# Patient Record
Sex: Male | Born: 1985 | Race: Black or African American | Hispanic: No | Marital: Single | Smoking: Current some day smoker
Health system: Southern US, Community
[De-identification: ages and names within clinical notes are randomized; demographics above are authoritative.]

---
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
## 2003-10-16 ENCOUNTER — Emergency Department (HOSPITAL_COMMUNITY): Admission: EM | Admit: 2003-10-16 | Discharge: 2003-10-16 | Payer: Self-pay | Admitting: Emergency Medicine

## 2013-11-12 ENCOUNTER — Ambulatory Visit: Payer: Self-pay | Admitting: Physician Assistant

## 2013-11-12 VITALS — BP 116/68 | HR 70 | Temp 98.2°F | Resp 16 | Ht 69.0 in | Wt 194.4 lb

## 2013-11-12 DIAGNOSIS — M62838 Other muscle spasm: Secondary | ICD-10-CM

## 2013-11-12 DIAGNOSIS — M542 Cervicalgia: Secondary | ICD-10-CM

## 2013-11-12 MED ORDER — DICLOFENAC SODIUM 75 MG PO TBEC: 75.0000 mg | DELAYED_RELEASE_TABLET | Freq: Two times a day (BID) | ORAL | Status: AC | PRN

## 2013-11-12 MED ORDER — CYCLOBENZAPRINE HCL 5 MG PO TABS: 5.0000 mg | ORAL_TABLET | Freq: Three times a day (TID) | ORAL | Status: AC | PRN

## 2013-11-12 NOTE — Progress Notes (Signed)
   Subjective:    Patient ID: Marcus Goodman, male    DOB: 1986-01-19, 28 y.o.   MRN: [id number]  HPI Pt presents to clinic for evaluation of his neck pain that started about 2 weeks ago when he was playing basketball and he was hit on the left side of his face pushing his head to the right side of his body.  He has had mid-neck pain since then.  He has been using heat and advil pm at night but he has not improved like he expected and he has started to get concerned.  He is not having any weakness, paresthesias or numbness in his arms/fingers. He has never had an injury to his neck in the past.  He currently does not have pain in face or teeth.  He also has an area on his left flank that does not bother hum but it has been there for several months and he just wants to make sure it is ok.  Home treatment - Heat and advil pm (2 pills at night)  Review of Systems  Neurological: Negative for weakness and numbness.       Objective:   Physical Exam  Vitals reviewed. Constitutional: He is oriented to person, place, and time. He appears well-developed and well-nourished.  HENT:  Head: Normocephalic and atraumatic.  Right Ear: External ear normal.  Left Ear: External ear normal.  Pulmonary/Chest: Effort normal.  Musculoskeletal:       Cervical back: He exhibits decreased range of motion. He exhibits no tenderness.  Holding his head to the right.  Neurological: He is alert and oriented to person, place, and time.  Reflex Scores:      Tricep reflexes are 2+ on the right side and 2+ on the left side.      Bicep reflexes are 2+ on the right side and 2+ on the left side.      Brachioradialis reflexes are 2+ on the right side and 2+ on the left side. Skin: Skin is warm and dry. Rash noted. Rash is nodular.     Psychiatric: He has a normal mood and affect. His behavior is normal. Judgment and thought content normal.       Assessment & Plan:  Neck pain - Plan: diclofenac (VOLTAREN) 75 MG EC  tablet  Muscle spasms of head or neck - Plan: cyclobenzaprine (FLEXERIL) 5 MG tablet  Fibroma - continue to monitor but nothing concerning at this time.  Due to the length of time but a normal exam we will start with NSIADs and muscle relaxers and continued heat.  If he has no improvement in his symptoms he will f/u for an xray at that time.  Benny LennertSarah Weber PA-C  [facility] 11/12/2013 12:09 PM

## 2013-11-12 NOTE — Patient Instructions (Signed)
Heat to the area of your neck that is bothering you.

## 2015-10-11 ENCOUNTER — Emergency Department (INDEPENDENT_AMBULATORY_CARE_PROVIDER_SITE_OTHER)
Admission: EM | Admit: 2015-10-11 | Discharge: 2015-10-11 | Disposition: A | Discharge status: Home / Self Care | Payer: Self-pay | Source: Home / Self Care | Attending: Family Medicine | Admitting: Family Medicine

## 2015-10-11 ENCOUNTER — Encounter (HOSPITAL_COMMUNITY): Payer: Self-pay | Admitting: Emergency Medicine

## 2015-10-11 DIAGNOSIS — K2970 Gastritis, unspecified, without bleeding: Secondary | ICD-10-CM

## 2015-10-11 NOTE — ED Notes (Signed)
[facility] a [facility] medial and upper abdominal pain and diarrhea that started today.

## 2015-10-11 NOTE — Discharge Instructions (Signed)

## 2015-10-13 NOTE — ED Provider Notes (Signed)
CSN: [id number]     Arrival date & time 10/11/15  2002 History   First MD Initiated Contact with Patient 10/11/15 2117     Chief Complaint  Patient presents with  . Abdominal Pain  . Diarrhea   (Consider location/radiation/quality/duration/timing/severity/associated sxs/prior Treatment) HPI Ate ribs yesterday and 30 minutes later having diarrhea.  No pain or blood in stools. Taking fluids, not eating. 5 episodes of diarrhea in the last 24 hours.  History reviewed. No pertinent past medical history. History reviewed. No pertinent past surgical history. Family History  Problem Relation Age of Onset  . Cancer Father    Social History  Substance Use Topics  . Smoking status: Current Some Day Smoker  . Smokeless tobacco: None  . Alcohol Use: Yes     Comment: socially    Review of Systems diarrhea Allergies  Review of patient's allergies indicates no known allergies.  Home Medications   Prior to Admission medications   Medication Sig Start Date End Date Taking? Authorizing Provider  cyclobenzaprine (FLEXERIL) 5 MG tablet Take 1 tablet (5 mg total) by mouth 3 (three) times daily as needed for muscle spasms. 11/12/13   Morrell RiddleSarah L Weber, PA-C  diclofenac (VOLTAREN) 75 MG EC tablet Take 1 tablet (75 mg total) by mouth 2 (two) times daily as needed. 11/12/13   Morrell RiddleSarah L Weber, PA-C   Meds Ordered and Administered this Visit  Medications - No data to display  BP 109/64 mmHg  Pulse 64  Temp(Src) 98 F (36.7 C) (Oral)  Resp 18  SpO2 99% No data found.   Physical Exam NURSES NOTES AND VITAL SIGNS REVIEWED. CONSTITUTIONAL: Well developed, well nourished, no acute distress HEENT: normocephalic, atraumatic, right and left TM's are normal EYES: Conjunctiva normal NECK:normal ROM, supple, no adenopathy PULMONARY:No respiratory distress, normal effort, Lungs: CTAb/l, no wheezes, or increased work of breathing CARDIOVASCULAR: RRR, no murmur ABDOMEN: soft, ND, NT, +'ve  BS MUSCULOSKELETAL: Normal ROM of all extremities,  SKIN: warm and dry without rash PSYCHIATRIC: Mood and affect, behavior are normal  ED Course  Procedures (including critical care time)  Labs Review Labs Reviewed - No data to display  Imaging Review No results found.   Visual Acuity Review  Right Eye Distance:   Left Eye Distance:   Bilateral Distance:    Right Eye Near:   Left Eye Near:    Bilateral Near:      No meds suggested as symptoms are getting better.  Continue symptomatic treatment.  MDM   1. Gastritis     Patient is reassured that there are no issues that require transfer to higher level of care at this time or additional tests. Patient is advised to continue home symptomatic treatment. Patient is advised that if there are new or worsening symptoms to attend the emergency department, contact primary care provider, or return to UC. Instructions of care provided discharged home in stable condition. Return to work/school note provided.   THIS NOTE WAS GENERATED USING A VOICE RECOGNITION SOFTWARE PROGRAM. ALL REASONABLE EFFORTS  WERE MADE TO PROOFREAD THIS DOCUMENT FOR ACCURACY.  I have verbally reviewed the discharge instructions with the patient. A printed AVS was given to the patient.  All questions were answered prior to discharge.      Tharon AquasFrank C Laraya Pestka, PA 10/13/15 1002

## 2015-10-18 ENCOUNTER — Encounter (HOSPITAL_COMMUNITY): Payer: Self-pay | Admitting: Emergency Medicine

## 2015-10-18 ENCOUNTER — Emergency Department (INDEPENDENT_AMBULATORY_CARE_PROVIDER_SITE_OTHER)
Admission: EM | Admit: 2015-10-18 | Discharge: 2015-10-18 | Disposition: A | Discharge status: Home / Self Care | Payer: Self-pay | Source: Home / Self Care | Attending: Emergency Medicine | Admitting: Emergency Medicine

## 2015-10-18 DIAGNOSIS — K2970 Gastritis, unspecified, without bleeding: Secondary | ICD-10-CM

## 2015-10-18 MED ORDER — OMEPRAZOLE 40 MG PO CPDR: 40.0000 mg | DELAYED_RELEASE_CAPSULE | Freq: Every day | ORAL | Status: AC

## 2015-10-18 NOTE — ED Notes (Signed)
C/o persistent LUQ pain onset x1 week... Seen here on 3/25 for similar sx A&O x4... No acute distress.

## 2015-10-18 NOTE — Discharge Instructions (Signed)
I think your pain is coming from gastritis. This is irritation of the stomach lining. Take omeprazole daily for 2 weeks. After that you can use as needed. If this does not resolve the pain, the next step is likely seeing either a GI specialist or going to the emergency room for additional imaging and blood work.

## 2015-10-18 NOTE — ED Provider Notes (Signed)
CSN: [id number]     Arrival date & time 10/18/15  1859 History   First MD Initiated Contact with Patient 10/18/15 2056     Chief Complaint  Patient presents with  . Abdominal Pain   (Consider location/radiation/quality/duration/timing/severity/associated sxs/prior Treatment) HPI He is a 30 year old man here for evaluation of abdominal pain. He states this started about a week ago after eating some ribs. Initially, it was associated with some diarrhea, diarrhea has basically resolved at this point. He continues to have intermittent left-sided abdominal pain. He states it occurs a couple times a day. He is unable to identify any specific triggers. It lasts for about 30 minutes and then resolves. He states it will sometimes radiate up into his chest. He reports some nausea associated with it once, but none since. No fevers or chills. He has not seen any blood in stool.  History reviewed. No pertinent past medical history. History reviewed. No pertinent past surgical history. Family History  Problem Relation Age of Onset  . Cancer Father    Social History  Substance Use Topics  . Smoking status: Current Some Day Smoker  . Smokeless tobacco: None  . Alcohol Use: Yes     Comment: socially    Review of Systems History of present illness Allergies  Review of patient's allergies indicates no known allergies.  Home Medications   Prior to Admission medications   Medication Sig Start Date End Date Taking? Authorizing Provider  cyclobenzaprine (FLEXERIL) 5 MG tablet Take 1 tablet (5 mg total) by mouth 3 (three) times daily as needed for muscle spasms. 11/12/13   Morrell RiddleSarah L Weber, PA-C  diclofenac (VOLTAREN) 75 MG EC tablet Take 1 tablet (75 mg total) by mouth 2 (two) times daily as needed. 11/12/13   Morrell RiddleSarah L Weber, PA-C  omeprazole (PRILOSEC) 40 MG capsule Take 1 capsule (40 mg total) by mouth daily. For 2 weeks, then as needed. 10/18/15   Charm RingsErin J Peregrine Nolt, MD   Meds Ordered and Administered this Visit   Medications - No data to display  BP 119/82 mmHg  Pulse 51  Temp(Src) 98 F (36.7 C) (Oral)  Resp 16  SpO2 100% No data found.   Physical Exam  Constitutional: He is oriented to person, place, and time. He appears well-developed and well-nourished. No distress.  Neck: Neck supple.  Cardiovascular: Normal rate.   Pulmonary/Chest: Effort normal.  Abdominal: Soft. Bowel sounds are normal. He exhibits no distension. There is no tenderness. There is no rebound and no guarding.  Neurological: He is alert and oriented to person, place, and time.    ED Course  Procedures (including critical care time)  Labs Review Labs Reviewed - No data to display  Imaging Review No results found.   MDM   1. Gastritis    I suspect he has some mild gastritis. Will treat with a trial of omeprazole for 2 weeks. Follow-up as needed.    Charm RingsErin J Juanpablo Ciresi, MD 10/18/15 2133

## 2017-06-28 ENCOUNTER — Encounter (HOSPITAL_COMMUNITY): Payer: Self-pay | Admitting: Emergency Medicine

## 2017-06-28 ENCOUNTER — Ambulatory Visit (HOSPITAL_COMMUNITY)
Admission: EM | Admit: 2017-06-28 | Discharge: 2017-06-28 | Disposition: A | Discharge status: Home / Self Care | Payer: Self-pay | Attending: Internal Medicine | Admitting: Internal Medicine

## 2017-06-28 DIAGNOSIS — B349 Viral infection, unspecified: Secondary | ICD-10-CM

## 2017-06-28 DIAGNOSIS — J069 Acute upper respiratory infection, unspecified: Secondary | ICD-10-CM

## 2017-06-28 NOTE — ED Triage Notes (Signed)
PT reports fever Friday with body aches and fatigue. PT is feeling better today and is afebrile with no meds. PT requesting a work note to apply sick days to days missed.

## 2017-09-28 NOTE — ED Provider Notes (Signed)
[facility]    CSN: [id number] Arrival date & time: 06/28/17  1358     History   Chief Complaint Chief Complaint  Patient presents with  . Fever    HPI Marcus Goodman is a 32 y.o. male.   Pt reports fli-like symptoms for the last 4 days. He has missed work but is feeling better today. Denies N/V/D. Subjective fever has resolved.       History reviewed. No pertinent past medical history.  There are no active problems to display for this patient.   History reviewed. No pertinent surgical history.     Home Medications    Prior to Admission medications   Medication Sig Start Date End Date Taking? Authorizing Provider  cyclobenzaprine (FLEXERIL) 5 MG tablet Take 1 tablet (5 mg total) by mouth 3 (three) times daily as needed for muscle spasms. 11/12/13   Valarie Cones, Dema Severin, PA-C  diclofenac (VOLTAREN) 75 MG EC tablet Take 1 tablet (75 mg total) by mouth 2 (two) times daily as needed. 11/12/13   Weber, Dema Severin, PA-C  omeprazole (PRILOSEC) 40 MG capsule Take 1 capsule (40 mg total) by mouth daily. For 2 weeks, then as needed. 10/18/15   Charm Rings, MD    Family History Family History  Problem Relation Age of Onset  . Cancer Father     Social History Social History   Tobacco Use  . Smoking status: Current Some Day Smoker    Packs/day: 0.25    Types: Cigarettes  . Smokeless tobacco: Never Used  Substance Use Topics  . Alcohol use: Yes    Comment: socially  . Drug use: Not on file    Comment: marijuana - every blue moon     Allergies   Patient has no known allergies.   Review of Systems Review of Systems  Constitutional: Negative for chills and fever.  HENT: Negative for sore throat and tinnitus.   Eyes: Negative for redness.  Respiratory: Negative for cough and shortness of breath.   Cardiovascular: Negative for chest pain and palpitations.  Gastrointestinal: Negative for abdominal pain, diarrhea, nausea and vomiting.  Genitourinary: Negative  for dysuria, frequency and urgency.  Musculoskeletal: Negative for myalgias.  Skin: Negative for rash.       No lesions  Neurological: Negative for weakness.  Hematological: Does not bruise/bleed easily.  Psychiatric/Behavioral: Negative for suicidal ideas.     Physical Exam Triage Vital Signs ED Triage Vitals  Enc Vitals Group     BP 06/28/17 1439 (!) 144/71     Pulse Rate 06/28/17 1438 78     Resp 06/28/17 1438 16     Temp 06/28/17 1438 98.8 F (37.1 C)     Temp Source 06/28/17 1438 Oral     SpO2 06/28/17 1438 100 %     Weight 06/28/17 1439 180 lb (81.6 kg)     Height 06/28/17 1439 5\' 10"  (1.778 m)     Head Circumference --      Peak Flow --      Pain Score 06/28/17 1439 0     Pain Loc --      Pain Edu? --      Excl. in GC? --    No data found.  Updated Vital Signs BP (!) 144/71   Pulse 78   Temp 98.8 F (37.1 C) (Oral)   Resp 16   Ht 5\' 10"  (1.778 m)   Wt 180 lb (81.6 kg)   SpO2 100%  BMI 25.83 kg/m   Visual Acuity Right Eye Distance:   Left Eye Distance:   Bilateral Distance:    Right Eye Near:   Left Eye Near:    Bilateral Near:     Physical Exam  Constitutional: He is oriented to person, place, and time. He appears well-developed and well-nourished. No distress.  HENT:  Head: Normocephalic and atraumatic.  Mouth/Throat: Oropharynx is clear and moist.  Eyes: Conjunctivae and EOM are normal. Pupils are equal, round, and reactive to light. No scleral icterus.  Neck: Normal range of motion. Neck supple. No JVD present. No tracheal deviation present. No thyromegaly present.  Cardiovascular: Normal rate, regular rhythm and normal heart sounds. Exam reveals no gallop and no friction rub.  No murmur heard. Pulmonary/Chest: Effort normal and breath sounds normal. No respiratory distress.  Abdominal: Soft. Bowel sounds are normal. He exhibits no distension. There is no tenderness.  Musculoskeletal: Normal range of motion. He exhibits no edema.    Lymphadenopathy:    He has no cervical adenopathy.  Neurological: He is alert and oriented to person, place, and time. No cranial nerve deficit.  Skin: Skin is warm and dry. No rash noted. No erythema.  Psychiatric: He has a normal mood and affect. His behavior is normal. Judgment and thought content normal.     UC Treatments / Results  Labs (all labs ordered are listed, but only abnormal results are displayed) Labs Reviewed - No data to display  EKG  EKG Interpretation None       Radiology No results found.  Procedures Procedures (including critical care time)  Medications Ordered in UC Medications - No data to display   Initial Impression / Assessment and Plan / UC Course  I have reviewed the triage vital signs and the nursing notes.  Pertinent labs & imaging results that were available during my care of the patient were reviewed by me and considered in my medical decision making (see chart for details).     Improving viral URI.   Final Clinical Impressions(s) / UC Diagnoses   Final diagnoses:  Viral upper respiratory tract infection  Viral illness    ED Discharge Orders    None       Controlled Substance Prescriptions [place] Controlled Substance Registry consulted? Not Applicable   Arnaldo Nataliamond, Cerise Lieber S, MD 09/28/17 [phone]

## 2021-09-09 DIAGNOSIS — Z23 Encounter for immunization: Secondary | ICD-10-CM | POA: Diagnosis not present

## 2021-09-09 DIAGNOSIS — Z125 Encounter for screening for malignant neoplasm of prostate: Secondary | ICD-10-CM | POA: Diagnosis not present

## 2021-09-09 DIAGNOSIS — Z0000 Encounter for general adult medical examination without abnormal findings: Secondary | ICD-10-CM | POA: Diagnosis not present

## 2021-09-09 DIAGNOSIS — Z13220 Encounter for screening for lipoid disorders: Secondary | ICD-10-CM | POA: Diagnosis not present

## 2021-09-10 ENCOUNTER — Other Ambulatory Visit: Payer: Self-pay | Admitting: Physician Assistant

## 2021-09-10 DIAGNOSIS — N281 Cyst of kidney, acquired: Secondary | ICD-10-CM

## 2021-09-16 ENCOUNTER — Ambulatory Visit
Admission: RE | Admit: 2021-09-16 | Discharge: 2021-09-16 | Disposition: A | Discharge status: Home / Self Care | Payer: BLUE CROSS/BLUE SHIELD | Source: Ambulatory Visit | Attending: Physician Assistant | Admitting: Physician Assistant

## 2021-09-16 DIAGNOSIS — N281 Cyst of kidney, acquired: Secondary | ICD-10-CM | POA: Diagnosis not present

## 2021-12-17 DIAGNOSIS — J020 Streptococcal pharyngitis: Secondary | ICD-10-CM | POA: Diagnosis not present

## 2021-12-17 DIAGNOSIS — J029 Acute pharyngitis, unspecified: Secondary | ICD-10-CM | POA: Diagnosis not present

## 2022-07-06 DIAGNOSIS — R3915 Urgency of urination: Secondary | ICD-10-CM | POA: Diagnosis not present

## 2022-07-06 DIAGNOSIS — K921 Melena: Secondary | ICD-10-CM | POA: Diagnosis not present

## 2022-07-15 IMAGING — US US RENAL
1 series · 14 of 25 positions shown · non-contrast
Comparison: None.

CLINICAL DATA: Renal cyst seen in the prior study

EXAM:
RENAL / URINARY TRACT ULTRASOUND COMPLETE

[Series 1: us renal · 0.23mm/px · 14 of 38 slices shown]
[im 1/38]
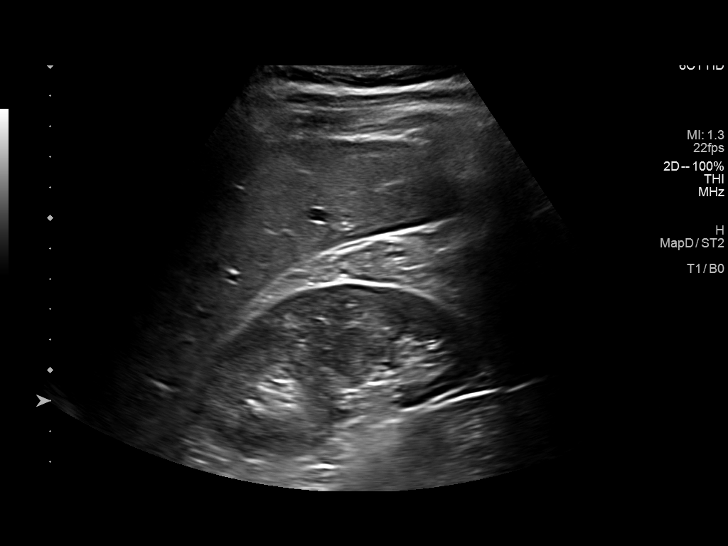
[im 4/38]
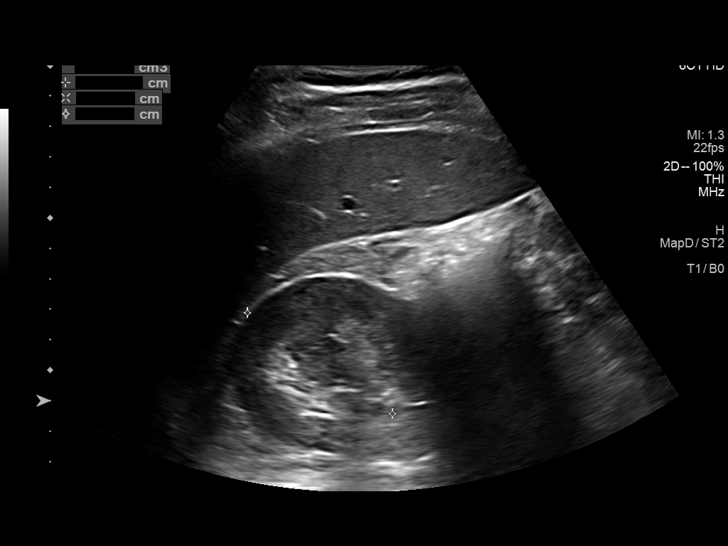
[im 7/38]
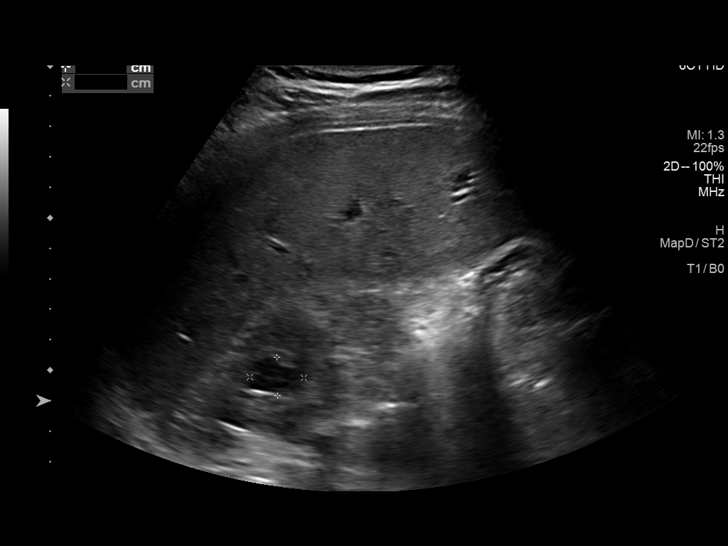
[im 10/38]
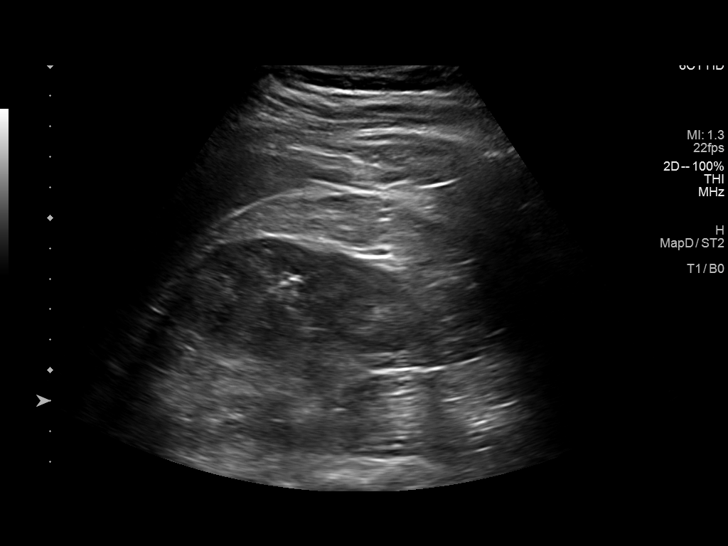
[im 13/38]
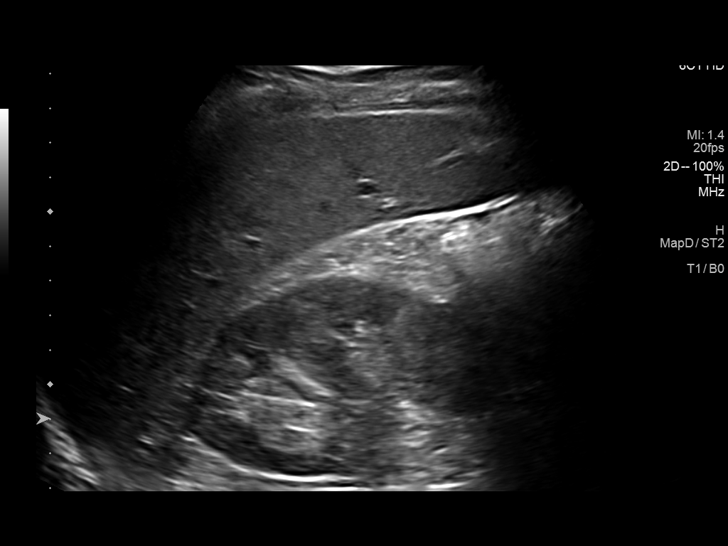
[im 14/38]
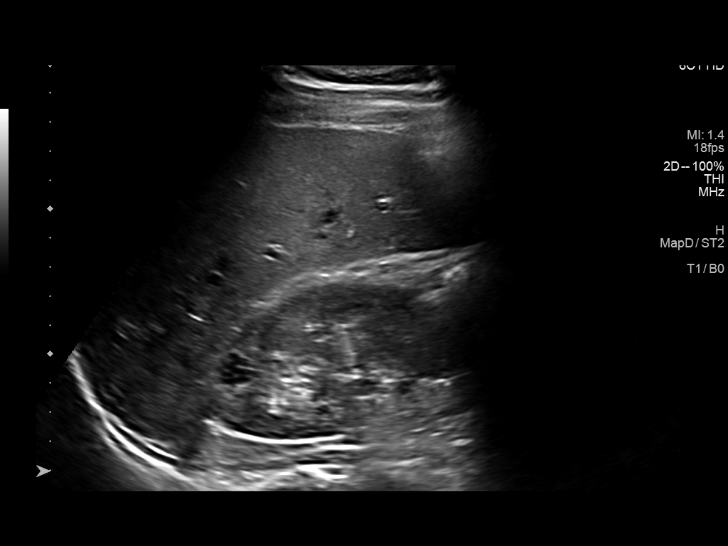
[im 17/38]
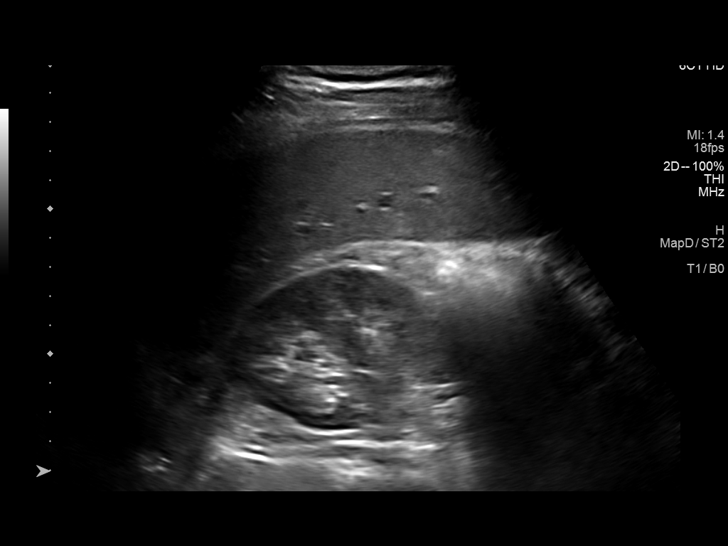
[im 21/38]
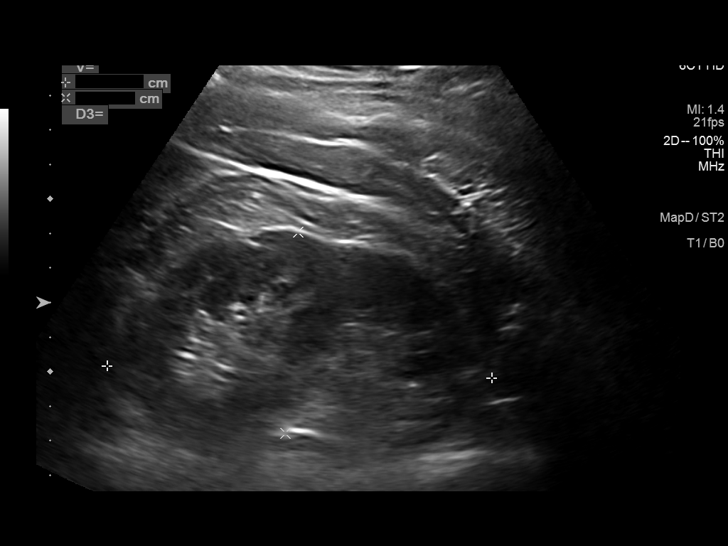
[im 24/38]
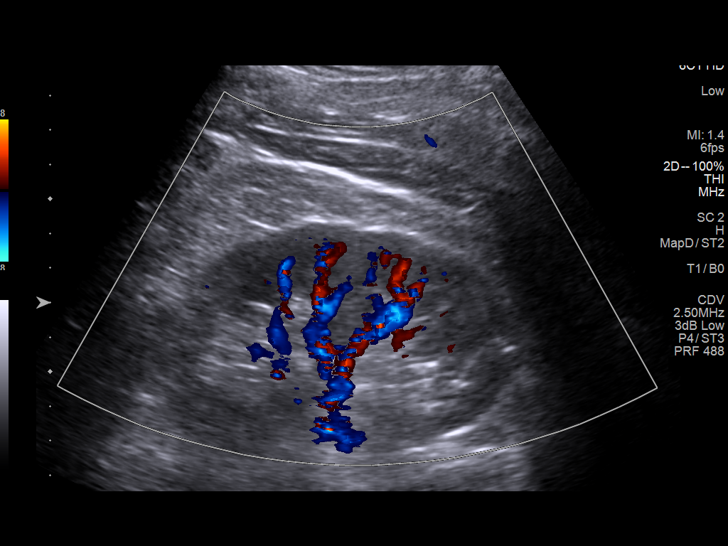
[im 25/38]
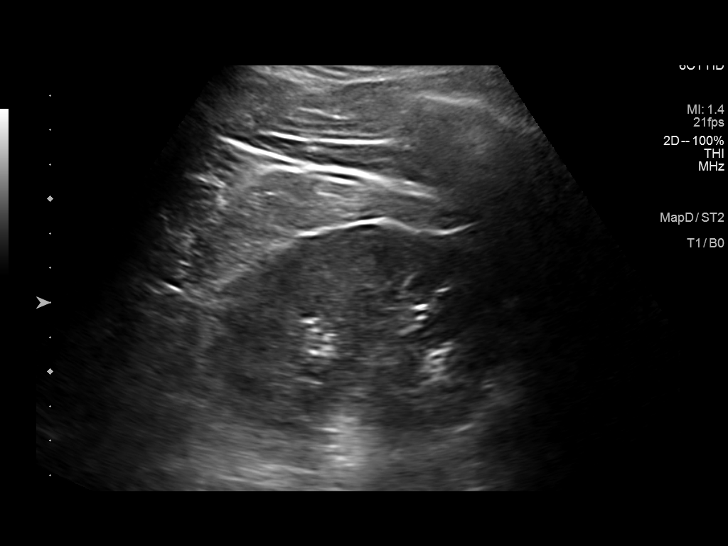
[im 28/38]
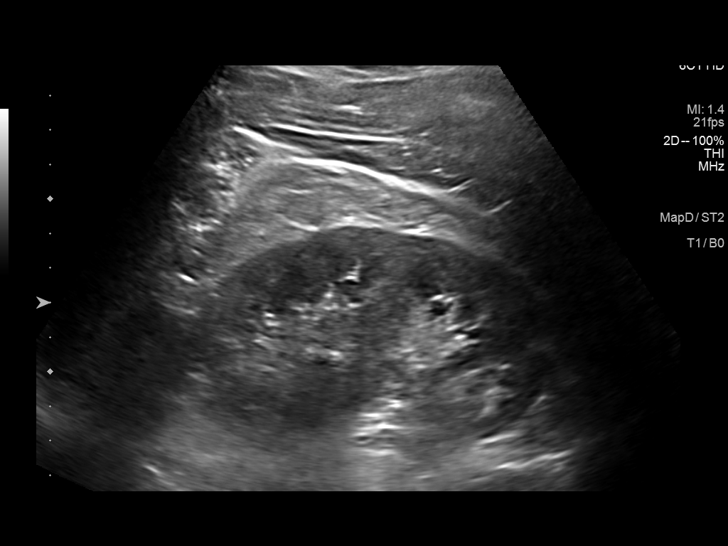
[im 31/38]
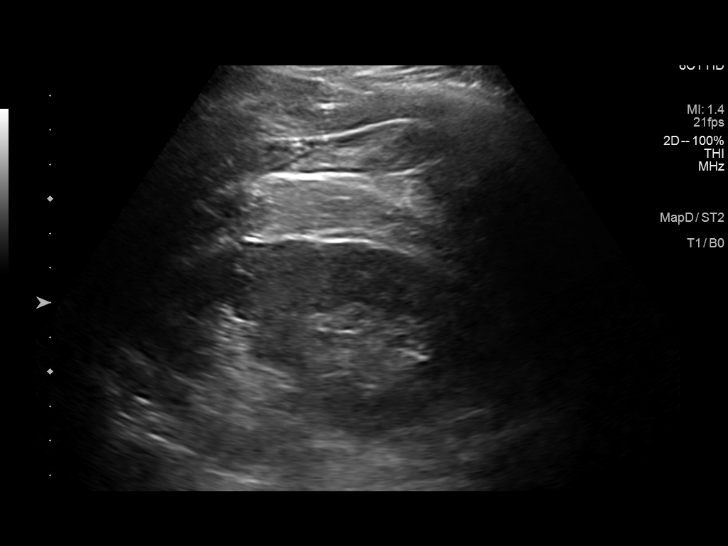
[im 34/38]
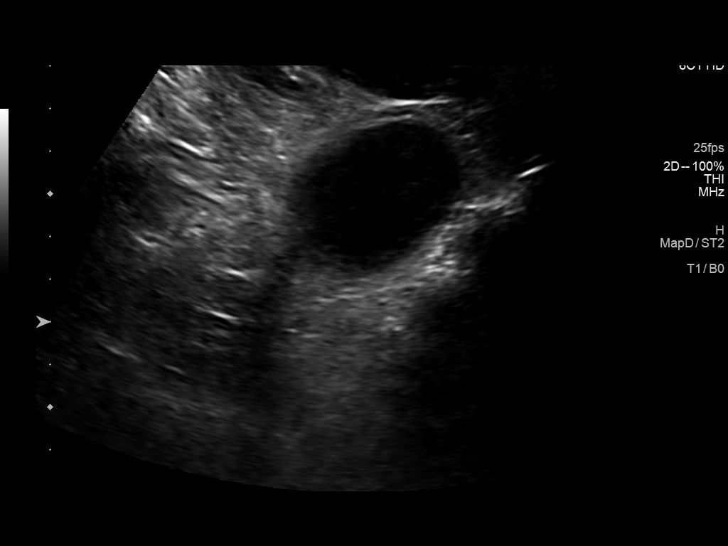
[im 38/38]
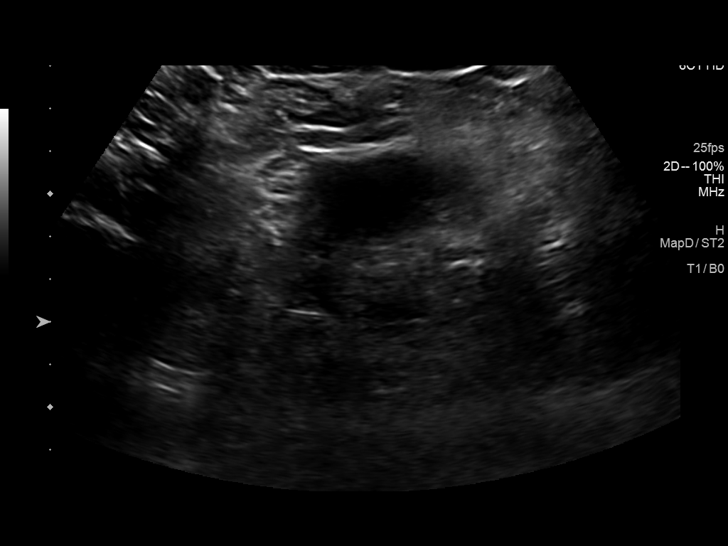

[14 of 25 positions shown; findings below may reference images not displayed]

FINDINGS: Right Kidney:

Renal measurements: 10.8 x 5.3 x 5.8 cm = volume: 172 mL. There is
no hydronephrosis. There is increased cortical echogenicity. There
is 1.8 x 1.3 x 1.1 cm hypoechoic structure in the upper pole with
possible internal echoes. There is no demonstrable thick internal
septation or mural nodule. There is no abnormal increased
vascularity in the or around this lesion.

Left Kidney:

Renal measurements: 11.2 x 5.8 x 3.8 cm = volume: 199 mL. There is
no hydronephrosis. There is increased cortical echogenicity.

Bladder:

Appears normal for degree of bladder distention.

Other:

None.
IMPRESSION: There is no hydronephrosis. Increased cortical echogenicity suggests
possible medical renal disease.

There is 1.8 x 1.3 x 1.1 cm cyst with possible internal debris in
the upper pole of right kidney. This may suggest hemorrhagic cyst.
There are no thick internal septations or mural nodules in the
lesion. There is no abnormal increased vascularity in or around this
lesion.

## 2023-08-15 DIAGNOSIS — J101 Influenza due to other identified influenza virus with other respiratory manifestations: Secondary | ICD-10-CM | POA: Diagnosis not present

## 2023-08-15 DIAGNOSIS — R509 Fever, unspecified: Secondary | ICD-10-CM | POA: Diagnosis not present

## 2023-10-18 DIAGNOSIS — Z13220 Encounter for screening for lipoid disorders: Secondary | ICD-10-CM | POA: Diagnosis not present

## 2023-10-18 DIAGNOSIS — Z136 Encounter for screening for cardiovascular disorders: Secondary | ICD-10-CM | POA: Diagnosis not present

## 2023-10-18 DIAGNOSIS — Z0001 Encounter for general adult medical examination with abnormal findings: Secondary | ICD-10-CM | POA: Diagnosis not present

## 2023-10-18 DIAGNOSIS — Z23 Encounter for immunization: Secondary | ICD-10-CM | POA: Diagnosis not present

## 2024-06-08 DIAGNOSIS — E559 Vitamin D deficiency, unspecified: Secondary | ICD-10-CM | POA: Diagnosis not present

## 2024-06-08 DIAGNOSIS — Z0001 Encounter for general adult medical examination with abnormal findings: Secondary | ICD-10-CM | POA: Diagnosis not present

## 2024-06-08 DIAGNOSIS — Z113 Encounter for screening for infections with a predominantly sexual mode of transmission: Secondary | ICD-10-CM | POA: Diagnosis not present

## 2024-06-08 DIAGNOSIS — Z131 Encounter for screening for diabetes mellitus: Secondary | ICD-10-CM | POA: Diagnosis not present
